# Patient Record
Sex: Male | Born: 1965 | Race: Black or African American | Hispanic: No | Marital: Married | Smoking: Never smoker
Health system: Southern US, Community
[De-identification: ages and names within clinical notes are randomized; demographics above are authoritative.]

## PROBLEM LIST (undated history)

## (undated) DIAGNOSIS — M1120 Other chondrocalcinosis, unspecified site: Secondary | ICD-10-CM

## (undated) HISTORY — PX: SHOULDER SURGERY: SHX246

---

## 2000-02-03 ENCOUNTER — Other Ambulatory Visit: Admission: RE | Admit: 2000-02-03 | Discharge: 2000-02-03 | Payer: Self-pay | Admitting: Urology

## 2001-06-22 ENCOUNTER — Emergency Department (HOSPITAL_COMMUNITY): Admission: EM | Admit: 2001-06-22 | Discharge: 2001-06-22 | Payer: Self-pay | Admitting: Emergency Medicine

## 2011-12-23 ENCOUNTER — Emergency Department (HOSPITAL_BASED_OUTPATIENT_CLINIC_OR_DEPARTMENT_OTHER): Payer: Federal, State, Local not specified - PPO

## 2011-12-23 ENCOUNTER — Encounter (HOSPITAL_BASED_OUTPATIENT_CLINIC_OR_DEPARTMENT_OTHER): Payer: Self-pay | Admitting: *Deleted

## 2011-12-23 ENCOUNTER — Emergency Department (HOSPITAL_BASED_OUTPATIENT_CLINIC_OR_DEPARTMENT_OTHER)
Admission: EM | Admit: 2011-12-23 | Discharge: 2011-12-23 | Disposition: A | Discharge status: Home / Self Care | Payer: Federal, State, Local not specified - PPO | Attending: Emergency Medicine | Admitting: Emergency Medicine

## 2011-12-23 DIAGNOSIS — M25462 Effusion, left knee: Secondary | ICD-10-CM

## 2011-12-23 DIAGNOSIS — M25469 Effusion, unspecified knee: Secondary | ICD-10-CM | POA: Insufficient documentation

## 2011-12-23 HISTORY — DX: Other chondrocalcinosis, unspecified site: M11.20

## 2011-12-23 MED ORDER — HYDROCODONE-ACETAMINOPHEN 5-500 MG PO TABS: ORAL_TABLET | ORAL | Status: AC

## 2011-12-23 NOTE — ED Notes (Signed)
Patient transported to X-ray 

## 2011-12-23 NOTE — ED Notes (Addendum)
Pt states he has had "calcium build up " in his left knee x 8 yrs. Worked last p.m. And woke up with left knee pain. No known injury. Hx pseudogout and took Indomethacin this a.m.

## 2011-12-23 NOTE — ED Notes (Signed)
Pt given prescription for Vicodin.

## 2011-12-23 NOTE — ED Provider Notes (Addendum)
History     CSN: [id number]  Arrival date & time 12/23/11  1343   First MD Initiated Contact with Patient 12/23/11 1526      Chief Complaint  Patient presents with  . Knee Pain    (Consider location/radiation/quality/duration/timing/severity/associated sxs/prior treatment) HPI Comments: Pt c/o L knee pain and swelling for the past few days.  No known injury.  No fever or chills.  The history is provided by the patient. No language interpreter was used.    Past Medical History  Diagnosis Date  . Pseudogout     Past Surgical History  Procedure Date  . Shoulder surgery     History reviewed. No pertinent family history.  History  Substance Use Topics  . Smoking status: Never Smoker   . Smokeless tobacco: Not on file  . Alcohol Use: No      Review of Systems  Constitutional: Negative for fever and chills.  Musculoskeletal:       Knee pain     Allergies  Review of patient's allergies indicates no known allergies.  Home Medications   Current Outpatient Rx  Name Route Sig Dispense Refill  . ALLOPURINOL 100 MG PO TABS Oral Take 100 mg by mouth daily.    . INDOMETHACIN 50 MG PO CAPS Oral Take 50 mg by mouth 2 (two) times daily with a meal.    . HYDROCODONE-ACETAMINOPHEN 5-500 MG PO TABS  One tab po  q 6 hrs prn pain 12 tablet 0    BP 145/98  Pulse 50  Temp 98.1 F (36.7 C) (Oral)  Resp 18  Ht 5\' 4"  (1.626 m)  Wt 178 lb (80.74 kg)  BMI 30.55 kg/m2  SpO2 95%  Physical Exam  Nursing note and vitals reviewed. Constitutional: He is oriented to person, place, and time. He appears well-developed and well-nourished.  HENT:  Head: Normocephalic and atraumatic.  Eyes: EOM are normal.  Neck: Normal range of motion.  Cardiovascular: Normal rate, regular rhythm, normal heart sounds and intact distal pulses.   Pulmonary/Chest: Effort normal and breath sounds normal. No respiratory distress.  Abdominal: Soft. He exhibits no distension. There is no tenderness.    Musculoskeletal:       Left knee: He exhibits decreased range of motion, swelling and effusion. He exhibits no ecchymosis, no deformity and no laceration. tenderness found.       Legs: Neurological: He is alert and oriented to person, place, and time.  Skin: Skin is warm and dry.  Psychiatric: He has a normal mood and affect. Judgment normal.    ED Course  Procedures (including critical care time)  Labs Reviewed - No data to display No results found.   1. Knee effusion, left       MDM  Knee immobilizer Has crutches Ice Indocin (has) rx-hydrocodone, 12         Evalina Field, [place] 12/23/11 1720  Evalina Field, PA 12/26/11 0831  Evalina Field, PA 01/11/12 [phone]

## 2011-12-24 NOTE — ED Provider Notes (Signed)
Medical screening examination/treatment/procedure(s) were performed by non-physician practitioner and as supervising physician I was immediately available for consultation/collaboration.   Richardean Canal, MD 12/24/11 [phone]

## 2011-12-27 NOTE — ED Provider Notes (Signed)
Medical screening examination/treatment/procedure(s) were performed by non-physician practitioner and as supervising physician I was immediately available for consultation/collaboration.   Richardean Canal, MD 12/27/11 1450

## 2012-01-13 NOTE — ED Provider Notes (Signed)
Medical screening examination/treatment/procedure(s) were performed by non-physician practitioner and as supervising physician I was immediately available for consultation/collaboration.   Kalayla Shadden H Miliani Deike, MD 01/13/12 0901 

## 2017-12-17 ENCOUNTER — Emergency Department (HOSPITAL_BASED_OUTPATIENT_CLINIC_OR_DEPARTMENT_OTHER)
Admission: EM | Admit: 2017-12-17 | Discharge: 2017-12-18 | Disposition: A | Discharge status: Home / Self Care | Payer: Federal, State, Local not specified - PPO | Attending: Emergency Medicine | Admitting: Emergency Medicine

## 2017-12-17 DIAGNOSIS — Z79899 Other long term (current) drug therapy: Secondary | ICD-10-CM | POA: Insufficient documentation

## 2017-12-17 DIAGNOSIS — M25531 Pain in right wrist: Secondary | ICD-10-CM | POA: Insufficient documentation

## 2017-12-18 ENCOUNTER — Encounter (HOSPITAL_BASED_OUTPATIENT_CLINIC_OR_DEPARTMENT_OTHER): Payer: Self-pay | Admitting: Emergency Medicine

## 2017-12-18 ENCOUNTER — Other Ambulatory Visit: Payer: Self-pay

## 2017-12-18 LAB — CBG MONITORING, ED: Glucose-Capillary: 109 mg/dL — ABNORMAL HIGH (ref 70–99)

## 2017-12-18 MED ORDER — KETOROLAC TROMETHAMINE 60 MG/2ML IM SOLN
30.0000 mg | Freq: Once | INTRAMUSCULAR | Status: AC
  Administered 2017-12-18: 30 mg via INTRAMUSCULAR
  Filled 2017-12-18: qty 2

## 2017-12-18 NOTE — ED Triage Notes (Signed)
PT presents with c/o right wrist pain since yesterday but gradually getting worse, pt thinks this is his gout pain. No injury per patient

## 2017-12-18 NOTE — ED Notes (Signed)
Pt discharged to home with family. NAD.  

## 2017-12-18 NOTE — ED Provider Notes (Addendum)
[facility] EMERGENCY DEPARTMENT Provider Note  CSN: [id number] Arrival date & time: 12/17/17 2353  Chief Complaint(s) Wrist Pain  HPI Gary Patterson is a 52 y.o. male with a history of gout/pseudogout who presents to the emergency department with 2 days of right wrist pain similar to prior flares.  Patient is tried taking indomethacin at home which provided some relief.  Only took one dose earlier this afternoon.  Just prior to arrival, patient took a tramadol which is not provided any relief.  Pain is exacerbated with range of motion and palpation of the wrists.  Associated with mild swelling.  Patient has nodule on the radial and volar aspects of the wrist which she reports is been there for a long time.  This is the area that is tender to palpation.  He denies any trauma or injuries.  Denies any IV drug use.  Denies any recent fevers or infections.  No focal deficit or weakness.  No loss of sensation.  HPI    Past Medical History Past Medical History:  Diagnosis Date  . Pseudogout    There are no active problems to display for this patient.  Home Medication(s) Prior to Admission medications   Medication Sig Start Date End Date Taking? Authorizing Provider  allopurinol (ZYLOPRIM) 100 MG tablet Take 100 mg by mouth daily.    [provider]  HYDROcodone-acetaminophen (VICODIN) 5-500 MG per tablet One tab po  q 6 hrs prn pain 12/23/11   Worthy Rancher, PA-C  indomethacin (INDOCIN) 50 MG capsule Take 50 mg by mouth 2 (two) times daily with a meal.    [provider]                                                                                                                                    Past Surgical History Past Surgical History:  Procedure Laterality Date  . SHOULDER SURGERY     Family History No family history on file.  Social History Social History   Tobacco Use  . Smoking status: Never Smoker  Substance Use Topics  . Alcohol use: No   . Drug use: No   Allergies Patient has no known allergies.  Review of Systems Review of Systems All other systems are reviewed and are negative for acute change except as noted in the HPI  Physical Exam Vital Signs  I have reviewed the triage vital signs BP (!) 142/96   Pulse 85   Temp 98.8 F (37.1 C) (Oral)   Resp 19   SpO2 100%   Physical Exam  Constitutional: He is oriented to person, place, and time. He appears well-developed and well-nourished. No distress.  HENT:  Head: Normocephalic and atraumatic.  Right Ear: External ear normal.  Left Ear: External ear normal.  Nose: Nose normal.  Mouth/Throat: Mucous membranes are normal. No trismus in the jaw.  Eyes: Conjunctivae and EOM are normal. No  scleral icterus.  Neck: Normal range of motion and phonation normal.  Cardiovascular: Normal rate and regular rhythm.  Pulmonary/Chest: Effort normal. No stridor. No respiratory distress.  Abdominal: He exhibits no distension.  Musculoskeletal: He exhibits no edema.       Right wrist: He exhibits decreased range of motion, tenderness and swelling. He exhibits no deformity.       Left hand: Normal sensation noted. Normal strength noted.       Hands: Neurological: He is alert and oriented to person, place, and time.  Skin: He is not diaphoretic.  Psychiatric: He has a normal mood and affect. His behavior is normal.  Vitals reviewed.   ED Results and Treatments Labs (all labs ordered are listed, but only abnormal results are displayed) Labs Reviewed  CBG MONITORING, ED - Abnormal; Notable for the following components:      Result Value   Glucose-Capillary 109 (*)    All other components within normal limits                                                                                                                         EKG  EKG Interpretation  Date/Time:  Tuesday December 18 2017 00:56:34 EDT Ventricular Rate:  80 PR Interval:    QRS Duration: 89 QT  Interval:  371 QTC Calculation: 428 R Axis:   60 Text Interpretation:  Sinus rhythm Consider left atrial enlargement NO STEMI Early repolarization No old tracing to compare Confirmed by Drema Pryardama, Dareion Kneece [phone]([id number]) on 12/18/2017 4:59:36 AM      Radiology No results found. Pertinent labs & imaging results that were available during my care of the patient were reviewed by me and considered in my medical decision making (see chart for details).  Medications Ordered in ED Medications  ketorolac (TORADOL) injection 30 mg (30 mg Intramuscular Given 12/18/17 0107)                                                                                                                                    Procedures Procedures  (including critical care time)  Medical Decision Making / ED Course I have reviewed the nursing notes for this encounter and the patient's prior records (if available in EHR or on provided paperwork).    Right wrist pain.  Reports similar to prior gout flares.  Doubt septic arthritis.  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  Doubt abscess or infectious process.  During examination patient became diaphoretic complaining of lightheadedness.  Likely vasovagal from pain.  EKG without acute ischemic changes, dysrhythmias or blocks.  CBG within normal limits.  Patient provided with oral intake.  For right wrist pain, he was provided with IM Toradol resulting in improved pain.  On review of record patient was seen last month for similar presentation and prescribed prednisone.  Will hold off on any prednisone medication at this time.  Instructed patient to use indomethacin for pain control.  Instructed to follow-up closely with his primary care provider and hand surgeon for evaluation of possible ganglioma.  The patient appears reasonably screened and/or stabilized for discharge and I doubt any other medical condition or other [facility] requiring further screening,  evaluation, or treatment in the ED at this time prior to discharge.  The patient is safe for discharge with strict return precautions.   Final Clinical Impression(s) / ED Diagnoses Final diagnoses:  Right wrist pain   Disposition: Discharge  Condition: Good  I have discussed the results, Dx and Tx plan with the patient who expressed understanding and agree(s) with the plan. Discharge instructions discussed at great length. The patient was given strict return precautions who verbalized understanding of the instructions. No further questions at time of discharge.    ED Discharge Orders    None       Follow Up: Primary care provider  Schedule an appointment as soon as possible for a visit  If symptoms do not improve or  worsen       This chart was dictated using voice recognition software.  Despite best efforts to proofread,  errors can occur which can change the documentation meaning.       Nira Conn, MD 12/18/17 0500

## 2020-07-30 ENCOUNTER — Other Ambulatory Visit: Payer: Self-pay

## 2020-07-30 ENCOUNTER — Emergency Department (HOSPITAL_BASED_OUTPATIENT_CLINIC_OR_DEPARTMENT_OTHER)
Admission: EM | Admit: 2020-07-30 | Discharge: 2020-07-31 | Disposition: A | Discharge status: Home / Self Care | Payer: 59 | Attending: Emergency Medicine | Admitting: Emergency Medicine

## 2020-07-30 ENCOUNTER — Emergency Department (HOSPITAL_BASED_OUTPATIENT_CLINIC_OR_DEPARTMENT_OTHER): Payer: 59

## 2020-07-30 ENCOUNTER — Encounter (HOSPITAL_BASED_OUTPATIENT_CLINIC_OR_DEPARTMENT_OTHER): Payer: Self-pay | Admitting: Emergency Medicine

## 2020-07-30 DIAGNOSIS — M25562 Pain in left knee: Secondary | ICD-10-CM | POA: Diagnosis not present

## 2020-07-30 MED ORDER — ACETAMINOPHEN 500 MG PO TABS
1000.0000 mg | ORAL_TABLET | Freq: Once | ORAL | Status: AC
  Administered 2020-07-30: 1000 mg via ORAL
  Filled 2020-07-30: qty 2

## 2020-07-30 MED ORDER — DICLOFENAC SODIUM ER 100 MG PO TB24: 100.0000 mg | ORAL_TABLET | Freq: Every day | ORAL | 0 refills | Status: AC

## 2020-07-30 MED ORDER — LIDOCAINE 5 % EX PTCH
1.0000 | MEDICATED_PATCH | CUTANEOUS | Status: DC
  Administered 2020-07-30: 1 via TRANSDERMAL
  Filled 2020-07-30: qty 1

## 2020-07-30 MED ORDER — LIDOCAINE 5 % EX PTCH: 1.0000 | MEDICATED_PATCH | CUTANEOUS | 0 refills | Status: AC

## 2020-07-30 NOTE — ED Triage Notes (Signed)
Pt presents to ED POV. Pt c/o L knee pain for few days that worsened tonight. Pt able to bear weight

## 2020-07-31 NOTE — ED Provider Notes (Signed)
[facility] EMERGENCY DEPARTMENT Provider Note   CSN: [id number] Arrival date & time: 07/30/20  2149     History Chief Complaint  Patient presents with  . Knee Pain    Gary Patterson is a 55 y.o. male.  The history is provided by the patient.  Knee Pain Location:  Knee Injury: no   Knee location:  L knee Pain details:    Quality:  Aching   Radiates to:  Does not radiate   Severity:  Severe   Onset quality:  Gradual   Timing:  Constant   Progression:  Unchanged Chronicity:  New Foreign body present:  No foreign bodies Prior injury to area:  Unable to specify Relieved by:  Nothing Worsened by:  Nothing Ineffective treatments:  None tried Associated symptoms: no back pain, no decreased ROM, no fatigue, no fever, no itching, no muscle weakness, no neck pain, no numbness, no stiffness, no swelling and no tingling   Risk factors: no concern for non-accidental trauma        Past Medical History:  Diagnosis Date  . Pseudogout     There are no problems to display for this patient.   Past Surgical History:  Procedure Laterality Date  . SHOULDER SURGERY         History reviewed. No pertinent family history.  Social History   Tobacco Use  . Smoking status: Never Smoker  Substance Use Topics  . Alcohol use: No  . Drug use: No    Home Medications Prior to Admission medications   Medication Sig Start Date End Date Taking? Authorizing Provider  Diclofenac Sodium CR 100 MG 24 hr tablet Take 1 tablet (100 mg total) by mouth daily. 07/30/20  Yes Kelsey Durflinger, MD  lidocaine (LIDODERM) 5 % Place 1 patch onto the skin daily. Remove & Discard patch within 12 hours or as directed by MD 07/30/20  Yes Brityn Mastrogiovanni, MD  allopurinol (ZYLOPRIM) 100 MG tablet Take 100 mg by mouth daily.    [provider]  HYDROcodone-acetaminophen (VICODIN) 5-500 MG per tablet One tab po  q 6 hrs prn pain 12/23/11   Worthy Rancher, PA-C  indomethacin (INDOCIN) 50 MG  capsule Take 50 mg by mouth 2 (two) times daily with a meal.    [provider]    Allergies    Patient has no known allergies.  Review of Systems   Review of Systems  Constitutional: Negative for fatigue and fever.  HENT: Negative for congestion.   Eyes: Negative for visual disturbance.  Respiratory: Negative for shortness of breath.   Cardiovascular: Negative for chest pain.  Gastrointestinal: Negative for abdominal pain.  Genitourinary: Negative for difficulty urinating.  Musculoskeletal: Positive for arthralgias. Negative for back pain, neck pain and stiffness.  Skin: Negative for itching.  Neurological: Negative for dizziness.  Psychiatric/Behavioral: Negative for agitation.  All other systems reviewed and are negative.   Physical Exam Updated Vital Signs BP (!) 154/116 (BP Location: Right Arm)   Pulse 96   Temp 98.5 F (36.9 C) (Oral)   Resp 18   SpO2 97%   Physical Exam Vitals and nursing note reviewed.  Constitutional:      General: He is not in acute distress.    Appearance: Normal appearance.  HENT:     Head: Normocephalic and atraumatic.     Nose: Nose normal.     Mouth/Throat:     Mouth: Mucous membranes are moist.  Eyes:     Conjunctiva/sclera: Conjunctivae  normal.     Pupils: Pupils are equal, round, and reactive to light.  Cardiovascular:     Rate and Rhythm: Normal rate and regular rhythm.     Pulses: Normal pulses.     Heart sounds: Normal heart sounds.  Pulmonary:     Effort: Pulmonary effort is normal.     Breath sounds: Normal breath sounds.  Abdominal:     General: Abdomen is flat. Bowel sounds are normal.     Palpations: Abdomen is soft.     Tenderness: There is no abdominal tenderness. There is no guarding.  Musculoskeletal:        General: Normal range of motion.     Cervical back: Normal range of motion and neck supple.     Right knee: Normal.     Left knee: Normal. Normal range of motion. No LCL laxity, MCL laxity, ACL  laxity or PCL laxity.Normal pulse.     Instability Tests: Anterior drawer test negative. Posterior drawer test negative. Anterior Lachman test negative. Medial McMurray test negative.     Left lower leg: Normal.     Left ankle: Normal.     Left Achilles Tendon: Normal.     Left foot: Normal.     Comments: Negative Homan's sign   Skin:    General: Skin is warm and dry.     Capillary Refill: Capillary refill takes less than 2 seconds.  Neurological:     General: No focal deficit present.     Mental Status: He is alert and oriented to person, place, and time.     Deep Tendon Reflexes: Reflexes normal.  Psychiatric:        Mood and Affect: Mood normal.        Behavior: Behavior normal.     ED Results / Procedures / Treatments   Labs (all labs ordered are listed, but only abnormal results are displayed) Labs Reviewed - No data to display  EKG None  Radiology DG Knee 2 Views Left  Result Date: 07/30/2020 CLINICAL DATA:  Acute onset left knee pain for 2 days, history of gout EXAM: LEFT KNEE - 1-2 VIEW COMPARISON:  02/24/2019 FINDINGS: Frontal and lateral views of the left knee are obtained. No acute fracture, subluxation, or dislocation. Mild medial compartmental joint space narrowing unchanged. Bones are normally mineralized. No erosive changes. No joint effusion. Prominent soft tissue swelling over the tibial tubercle, stable. IMPRESSION: 1. Mild osteoarthritis. 2. No acute bony abnormality. 3. Stable soft tissue swelling over the tibial tubercle. Electronically Signed   By: Sharlet Salina M.D.   On: 07/30/2020 22:53    Procedures Procedures   Medications Ordered in ED Medications  lidocaine (LIDODERM) 5 % 1 patch (1 patch Transdermal Patch Applied 07/30/20 2328)  acetaminophen (TYLENOL) tablet 1,000 mg (1,000 mg Oral Given 07/30/20 2328)    ED Course  I have reviewed the triage vital signs and the nursing notes.  Pertinent labs & imaging results that were available during my  care of the patient were reviewed by me and considered in my medical decision making (see chart for details).   NSAIDs and lidoderm.  I sings of trauma.  No recent travel.  No calf pain or swelling.  Follow up with your PMD for ongoing care.    Gary Patterson was evaluated in Emergency Department on 07/31/2020 for the symptoms described in the history of present illness. He was evaluated in the context of the global COVID-19 pandemic, which necessitated consideration that the patient  might be at risk for infection with the SARS-CoV-2 virus that causes COVID-19. Institutional protocols and algorithms that pertain to the evaluation of patients at risk for COVID-19 are in a state of rapid change based on information released by regulatory bodies including the CDC and federal and state organizations. These policies and algorithms were followed during the patient's care in the ED.  Final Clinical Impression(s) / ED Diagnoses Final diagnoses:  Left knee pain, unspecified chronicity   Return for intractable cough, coughing up blood, fevers >100.4 unrelieved by medication, shortness of breath, intractable vomiting, chest pain, shortness of breath, weakness, numbness, changes in speech, facial asymmetry, abdominal pain, passing out, Inability to tolerate liquids or food, cough, altered mental status or any concerns. No signs of systemic illness or infection. The patient is nontoxic-appearing on exam and vital signs are within normal limits.  I have reviewed the triage vital signs and the nursing notes. Pertinent labs & imaging results that were available during my care of the patient were reviewed by me and considered in my medical decision making (see chart for details). After history, exam, and medical workup I feel the patient has been appropriately medically screened and is safe for discharge home. Pertinent diagnoses were discussed with the patient. Patient was given return precautions.    Rx / DC  Orders ED Discharge Orders         Ordered    Diclofenac Sodium CR 100 MG 24 hr tablet  Daily        07/30/20 2322    lidocaine (LIDODERM) 5 %  Every 24 hours        07/30/20 2322           Gopal Malter, MD 07/31/20 0013

## 2022-03-04 IMAGING — CR DG KNEE 1-2V*L*
2 series · 2 of 2 positions shown · non-contrast
Comparison: 02/24/2019

CLINICAL DATA: Acute onset left knee pain for 2 days, history of
gout

EXAM:
LEFT KNEE - 1-2 VIEW

[t knee ap left]
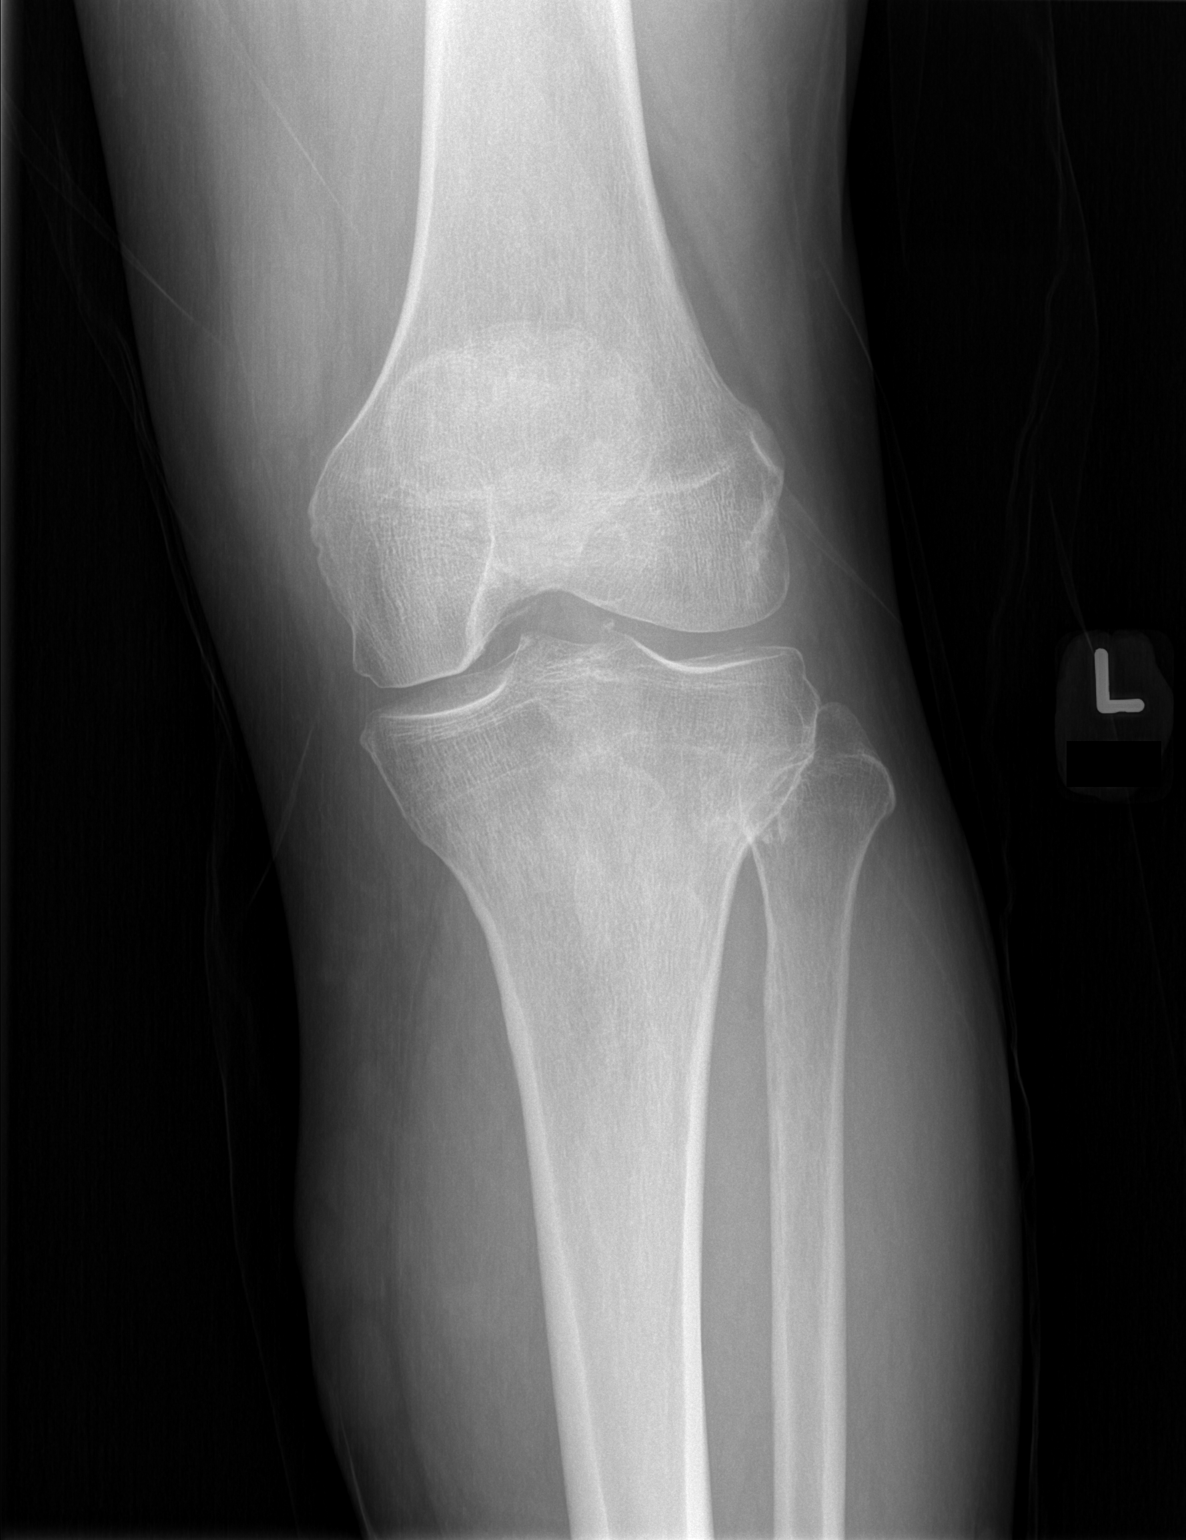

[t knee lat left]
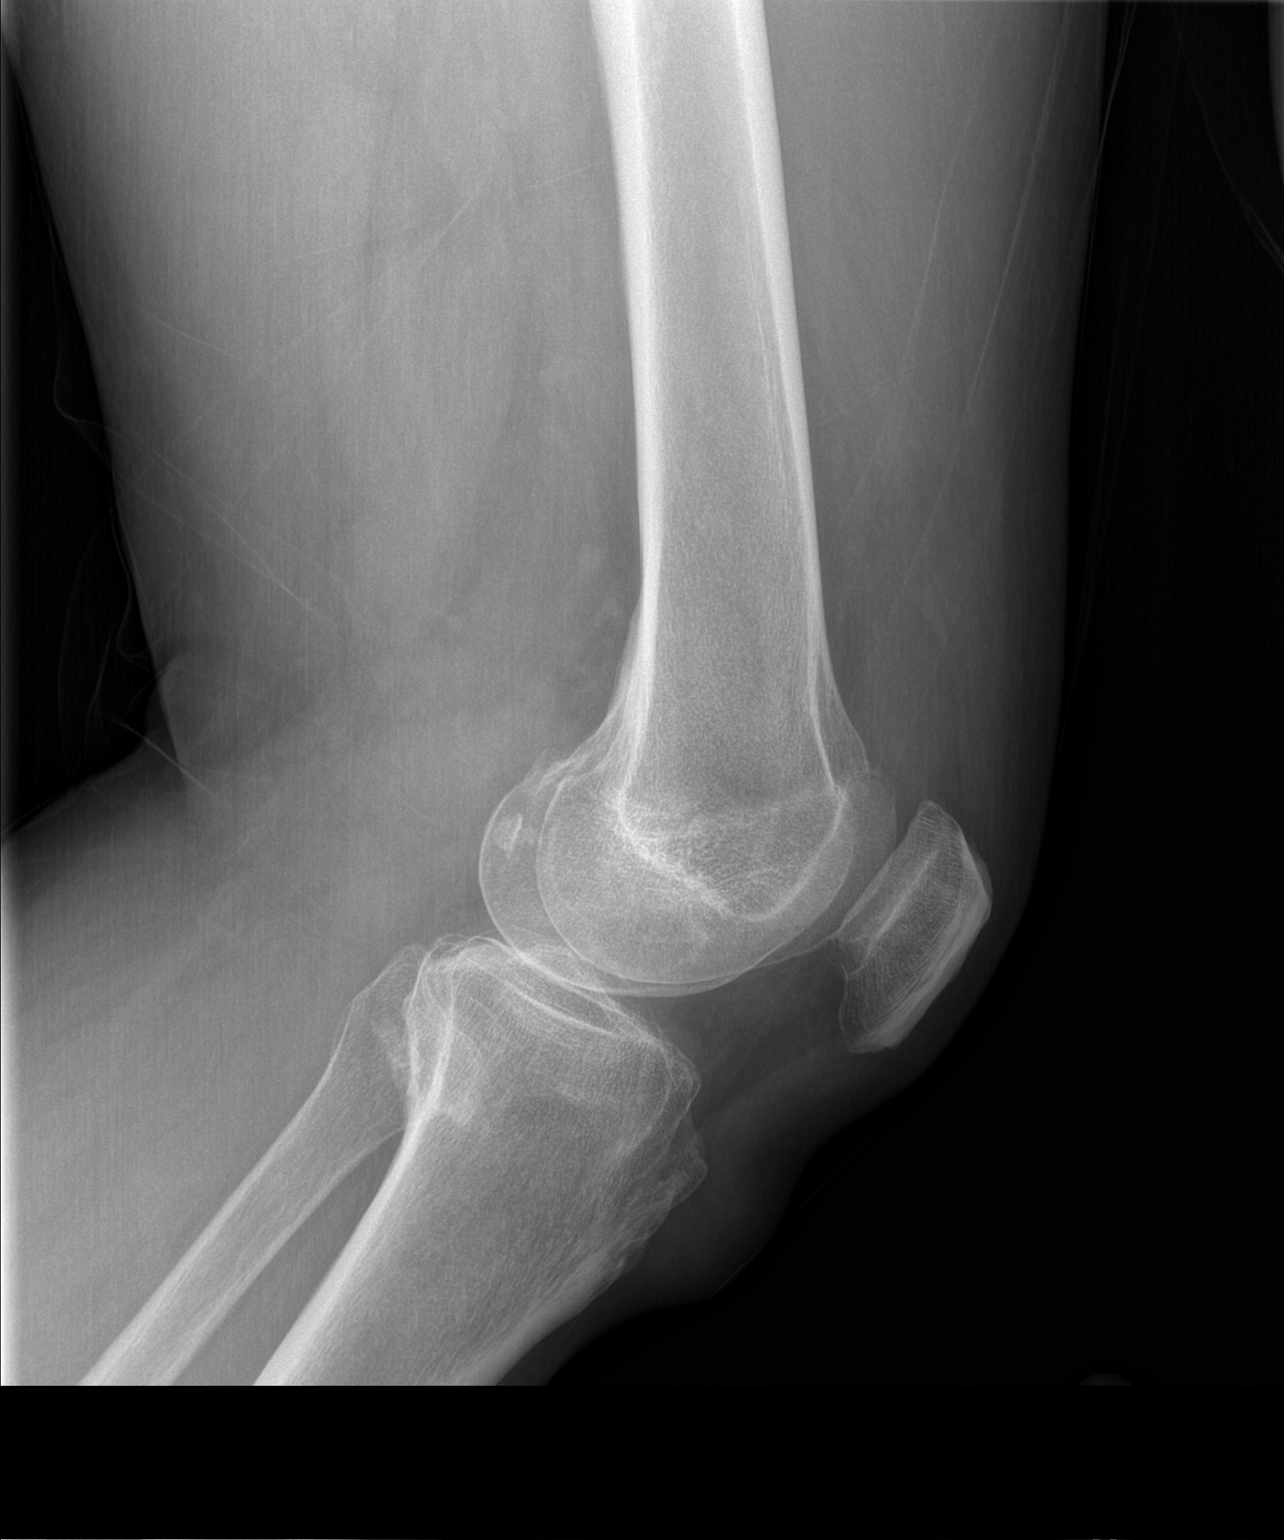

[2 of 2 positions shown; findings below may reference images not displayed]

FINDINGS: Frontal and lateral views of the left knee are obtained. No acute
fracture, subluxation, or dislocation. Mild medial compartmental
joint space narrowing unchanged. Bones are normally mineralized. No
erosive changes. No joint effusion. Prominent soft tissue swelling
over the tibial tubercle, stable.
IMPRESSION: 1. Mild osteoarthritis.
2. No acute bony abnormality.
3. Stable soft tissue swelling over the tibial tubercle.
